# Patient Record
Sex: Male | Born: 1970 | Race: White | Hispanic: No | Marital: Married | Smoking: Never smoker
Health system: Southern US, Community
[De-identification: ages and names within clinical notes are randomized; demographics above are authoritative.]

## PROBLEM LIST (undated history)

## (undated) DIAGNOSIS — E119 Type 2 diabetes mellitus without complications: Secondary | ICD-10-CM

---

## 2013-09-30 ENCOUNTER — Inpatient Hospital Stay (HOSPITAL_COMMUNITY)
Admission: EM | Admit: 2013-09-30 | Discharge: 2013-10-01 | DRG: 195 | Disposition: A | Discharge status: Home / Self Care | Payer: BC Managed Care – PPO | Attending: Internal Medicine | Admitting: Internal Medicine

## 2013-09-30 ENCOUNTER — Encounter (HOSPITAL_COMMUNITY): Payer: Self-pay | Admitting: Emergency Medicine

## 2013-09-30 ENCOUNTER — Emergency Department (HOSPITAL_COMMUNITY): Payer: BC Managed Care – PPO

## 2013-09-30 DIAGNOSIS — R5381 Other malaise: Secondary | ICD-10-CM | POA: Diagnosis present

## 2013-09-30 DIAGNOSIS — R0789 Other chest pain: Secondary | ICD-10-CM | POA: Diagnosis present

## 2013-09-30 DIAGNOSIS — R0902 Hypoxemia: Secondary | ICD-10-CM | POA: Diagnosis present

## 2013-09-30 DIAGNOSIS — Z91199 Patient's noncompliance with other medical treatment and regimen due to unspecified reason: Secondary | ICD-10-CM

## 2013-09-30 DIAGNOSIS — IMO0001 Reserved for inherently not codable concepts without codable children: Secondary | ICD-10-CM | POA: Diagnosis present

## 2013-09-30 DIAGNOSIS — Z9119 Patient's noncompliance with other medical treatment and regimen: Secondary | ICD-10-CM

## 2013-09-30 DIAGNOSIS — J189 Pneumonia, unspecified organism: Principal | ICD-10-CM | POA: Diagnosis present

## 2013-09-30 DIAGNOSIS — E119 Type 2 diabetes mellitus without complications: Secondary | ICD-10-CM

## 2013-09-30 DIAGNOSIS — R5383 Other fatigue: Secondary | ICD-10-CM

## 2013-09-30 DIAGNOSIS — E1165 Type 2 diabetes mellitus with hyperglycemia: Secondary | ICD-10-CM

## 2013-09-30 HISTORY — DX: Type 2 diabetes mellitus without complications: E11.9

## 2013-09-30 LAB — COMPREHENSIVE METABOLIC PANEL
ALT: 17 U/L (ref 0–53)
AST: 16 U/L (ref 0–37)
Albumin: 3.8 g/dL (ref 3.5–5.2)
Alkaline Phosphatase: 63 U/L (ref 39–117)
BUN: 18 mg/dL (ref 6–23)
CO2: 22 meq/L (ref 19–32)
CREATININE: 0.63 mg/dL (ref 0.50–1.35)
Calcium: 9 mg/dL (ref 8.4–10.5)
Chloride: 96 mEq/L (ref 96–112)
Glucose, Bld: 340 mg/dL — ABNORMAL HIGH (ref 70–99)
Potassium: 4.1 mEq/L (ref 3.7–5.3)
SODIUM: 135 meq/L — AB (ref 137–147)
TOTAL PROTEIN: 6.8 g/dL (ref 6.0–8.3)
Total Bilirubin: 0.4 mg/dL (ref 0.3–1.2)

## 2013-09-30 LAB — GLUCOSE, CAPILLARY
Glucose-Capillary: 310 mg/dL — ABNORMAL HIGH (ref 70–99)
Glucose-Capillary: 218 mg/dL — ABNORMAL HIGH (ref 70–99)
Glucose-Capillary: 326 mg/dL — ABNORMAL HIGH (ref 70–99)
Glucose-Capillary: 248 mg/dL — ABNORMAL HIGH (ref 70–99)
Glucose-Capillary: 301 mg/dL — ABNORMAL HIGH (ref 70–99)

## 2013-09-30 LAB — TROPONIN I
Troponin I: <0.3 ng/mL (ref <0.30)
Troponin I: <0.3 ng/mL (ref <0.30)
Troponin I: <0.3 ng/mL (ref <0.30)

## 2013-09-30 LAB — PRO B NATRIURETIC PEPTIDE: Pro B Natriuretic peptide (BNP): 24 pg/mL (ref 0–125)

## 2013-09-30 LAB — CBC WITH DIFFERENTIAL/PLATELET
Basophils Absolute: 0.1 10*3/uL (ref 0.0–0.1)
Basophils Relative: 1 % (ref 0–1)
EOS PCT: 1 % (ref 0–5)
Eosinophils Absolute: 0.2 10*3/uL (ref 0.0–0.7)
HEMATOCRIT: 49.7 % (ref 39.0–52.0)
Hemoglobin: 17.8 g/dL — ABNORMAL HIGH (ref 13.0–17.0)
LYMPHS ABS: 3.7 10*3/uL (ref 0.7–4.0)
LYMPHS PCT: 28 % (ref 12–46)
MCH: 30.2 pg (ref 26.0–34.0)
MCHC: 35.8 g/dL (ref 30.0–36.0)
MCV: 84.4 fL (ref 78.0–100.0)
Monocytes Absolute: 0.4 10*3/uL (ref 0.1–1.0)
Monocytes Relative: 3 % (ref 3–12)
Neutro Abs: 8.8 10*3/uL — ABNORMAL HIGH (ref 1.7–7.7)
Neutrophils Relative %: 67 % (ref 43–77)
Platelets: 254 10*3/uL (ref 150–400)
RBC: 5.89 MIL/uL — AB (ref 4.22–5.81)
RDW: 12.6 % (ref 11.5–15.5)
WBC: 13.1 10*3/uL — ABNORMAL HIGH (ref 4.0–10.5)

## 2013-09-30 LAB — INFLUENZA PANEL BY PCR (TYPE A & B)
H1N1 flu by pcr: NOT DETECTED
INFLBPCR: NEGATIVE
Influenza A By PCR: NEGATIVE

## 2013-09-30 LAB — STREP PNEUMONIAE URINARY ANTIGEN: Strep Pneumo Urinary Antigen: NEGATIVE

## 2013-09-30 LAB — HEMOGLOBIN A1C
Hgb A1c MFr Bld: 6 % — ABNORMAL HIGH (ref <5.7)
MEAN PLASMA GLUCOSE: 126 mg/dL — AB (ref <117)

## 2013-09-30 LAB — D-DIMER, QUANTITATIVE (NOT AT ARMC): D DIMER QUANT: 0.32 ug{FEU}/mL (ref 0.00–0.48)

## 2013-09-30 LAB — CG4 I-STAT (LACTIC ACID): Lactic Acid, Venous: 3.86 mmol/L — ABNORMAL HIGH (ref 0.5–2.2)

## 2013-09-30 MED ORDER — GUAIFENESIN ER 600 MG PO TB12
600.0000 mg | ORAL_TABLET | Freq: Two times a day (BID) | ORAL | Status: DC
  Administered 2013-09-30 – 2013-10-01 (×2): 600 mg via ORAL
  Filled 2013-09-30 (×3): qty 1

## 2013-09-30 MED ORDER — SODIUM CHLORIDE 0.9 % IV SOLN
INTRAVENOUS | Status: AC
  Administered 2013-09-30: 08:00:00 via INTRAVENOUS

## 2013-09-30 MED ORDER — ENOXAPARIN SODIUM 40 MG/0.4ML ~~LOC~~ SOLN
40.0000 mg | SUBCUTANEOUS | Status: DC
  Administered 2013-09-30: 40 mg via SUBCUTANEOUS
  Filled 2013-09-30 (×4): qty 0.4

## 2013-09-30 MED ORDER — IPRATROPIUM-ALBUTEROL 0.5-2.5 (3) MG/3ML IN SOLN: 3.0000 mL | RESPIRATORY_TRACT | Status: DC | PRN

## 2013-09-30 MED ORDER — LEVOFLOXACIN IN D5W 750 MG/150ML IV SOLN
750.0000 mg | Freq: Once | INTRAVENOUS | Status: AC
  Administered 2013-09-30: 750 mg via INTRAVENOUS
  Filled 2013-09-30: qty 150

## 2013-09-30 MED ORDER — LEVOFLOXACIN IN D5W 750 MG/150ML IV SOLN
750.0000 mg | INTRAVENOUS | Status: DC
  Administered 2013-09-30 – 2013-10-01 (×2): 750 mg via INTRAVENOUS
  Filled 2013-09-30 (×2): qty 150

## 2013-09-30 MED ORDER — SODIUM CHLORIDE 0.9 % IV SOLN
INTRAVENOUS | Status: AC
  Administered 2013-09-30: 09:00:00 via INTRAVENOUS

## 2013-09-30 MED ORDER — ACETAMINOPHEN 325 MG PO TABS
650.0000 mg | ORAL_TABLET | Freq: Four times a day (QID) | ORAL | Status: DC | PRN
  Administered 2013-09-30: 650 mg via ORAL
  Filled 2013-09-30: qty 2

## 2013-09-30 MED ORDER — PIOGLITAZONE HCL 30 MG PO TABS
30.0000 mg | ORAL_TABLET | Freq: Every day | ORAL | Status: DC
  Filled 2013-09-30: qty 1

## 2013-09-30 MED ORDER — SODIUM CHLORIDE 0.9 % IV BOLUS (SEPSIS)
1000.0000 mL | Freq: Once | INTRAVENOUS | Status: AC
Start: 2013-09-30 — End: 2013-09-30
  Administered 2013-09-30: 1000 mL via INTRAVENOUS

## 2013-09-30 MED ORDER — SODIUM CHLORIDE 0.9 % IV BOLUS (SEPSIS)
1000.0000 mL | Freq: Once | INTRAVENOUS | Status: AC
  Administered 2013-09-30: 1000 mL via INTRAVENOUS

## 2013-09-30 MED ORDER — INSULIN ASPART 100 UNIT/ML ~~LOC~~ SOLN: 0.0000 [IU] | Freq: Three times a day (TID) | SUBCUTANEOUS | Status: DC

## 2013-09-30 MED ORDER — PIOGLITAZONE HCL 45 MG PO TABS
45.0000 mg | ORAL_TABLET | Freq: Every day | ORAL | Status: DC
  Administered 2013-09-30 – 2013-10-01 (×2): 45 mg via ORAL
  Filled 2013-09-30 (×2): qty 1

## 2013-09-30 NOTE — Progress Notes (Signed)
Patient CBG elevated notified Dr. Gonzella Lexhungel who started patient on SSI with coverage but patient refused insulin but took the actos, even after educating patient.

## 2013-09-30 NOTE — Progress Notes (Signed)
[facility] PROGRESS NOTE  Reatha HarpsMark Mannis ZOX:[id number] DOB: [birth year] DOA: 09/30/2013 PCP: No primary provider on file.  Assessment/Plan: Community acquired pneumonia Placed on empiric abx  sats maintained on RA  continue IV fluids, tylenol and supportive care  follow blood cx, negative urine strep ag. Legionella ag pending. Flu PCR negative. Follow mycoplasma pneumonia ag.  DM Type 2  Uncontrolled. Patient on acts at home and reports not taking medications fr several weeks now. Placed on SSI. Check A1C  chest pain  likely pleuritic and currently pain free  EKG and serial troponins negative. D dimer negative. Low probability for PE.    DVT prophylaxis Sq lovenox  Diet: diabetic  Code Status: full code Family Communication: wife at bedside Disposition Plan: home once improved   Consultants:  none  Procedures:  none  Antibiotics:  IV Levaquin  HPI/Subjective: Feels his SOB to be better but very weak  Objective: Filed Vitals:   09/30/13 0810  BP: 131/68  Pulse: 98  Temp: 97.6 F (36.4 C)  Resp: 20   No intake or output data in the 24 hours ending 09/30/13 1359 Filed Weights   09/30/13 0810  Weight: 101.606 kg (224 lb)    Exam:   General:  Middle aged male in nAD   HEENT: no pallor, moist mucosa  CVS; normal S1&S2 , no MRG  Chest: right sided crackles, no rhonchi or wheeze  Abd: soft, NT, ND, BS+  Ext: warm, no edema;   CNS: AAOX3  Data Reviewed: Basic Metabolic Panel:  Recent Labs Lab 09/30/13 0335  NA 135*  K 4.1  CL 96  CO2 22  GLUCOSE 340*  BUN 18  CREATININE 0.63  CALCIUM 9.0   Liver Function Tests:  Recent Labs Lab 09/30/13 0335  AST 16  ALT 17  ALKPHOS 63  BILITOT 0.4  PROT 6.8  ALBUMIN 3.8   No results found for this basename: LIPASE, AMYLASE,  in the last 168 hours No results found for this basename: AMMONIA,  in the last 168 hours CBC:  Recent Labs Lab 09/30/13 0335  WBC 13.1*  NEUTROABS 8.8*   HGB 17.8*  HCT 49.7  MCV 84.4  PLT 254   Cardiac Enzymes:  Recent Labs Lab 09/30/13 0335 09/30/13 0712 09/30/13 1313  TROPONINI <0.30 <0.30 <0.30   BNP (last 3 results)  Recent Labs  09/30/13 0335  PROBNP 24.0   CBG:  Recent Labs Lab 09/30/13 0331 09/30/13 0743 09/30/13 1207  GLUCAP 310* 218* 326*    No results found for this or any previous visit (from the past 240 hour(s)).   Studies: Dg Chest Port 1 View  09/30/2013   CLINICAL DATA:  Sudden onset chest pain.  EXAM: PORTABLE CHEST - 1 VIEW  COMPARISON:  None.  FINDINGS: There is patchy lung opacity on the right. Pulmonary vascularity is congested, but the patient is supine. There is previous granulomatous infection with calcified right hilar and peritracheal nodes and a calcified nodule in the right apex. No cardiomegaly. No evidence of effusion or pneumothorax.  IMPRESSION: 1. Multi focal airspace disease on the right, primarily concerning for pneumonia. 2. Pulmonary venous congestion which is likely from supine positioning. 3. Remote pulmonary granulomatous infection.   Electronically Signed   By: Tiburcio PeaJonathan  Watts M.D.   On: 09/30/2013 03:53    Scheduled Meds: . sodium chloride   Intravenous STAT  . enoxaparin (LOVENOX) injection  40 mg Subcutaneous Q24H  . insulin aspart  0-15 Units Subcutaneous TID WC  .  levofloxacin (LEVAQUIN) IV  750 mg Intravenous Q24H   Continuous Infusions: . sodium chloride 75 mL/hr at 09/30/13 0929      Time spent: 25 minutes    Tashonna Descoteaux  [facility] Pager [phone]. If 7PM-7AM, please contact night-coverage at [email or web address], password [facility] 09/30/2013, 1:59 PM  LOS: 0 days

## 2013-09-30 NOTE — ED Notes (Signed)
Pt s wife states that he's a diabetic and doesn't take the medication that he's prescribed.

## 2013-09-30 NOTE — ED Notes (Signed)
Pt 88% RA.  Pt put on 2L [place].

## 2013-09-30 NOTE — ED Notes (Signed)
LACTIC ACID RESULTS GIVEN TO EDP YELVERTON 

## 2013-09-30 NOTE — ED Provider Notes (Signed)
CSN: [id number]     Arrival date & time 09/30/13  0308 History   First MD Initiated Contact with Patient 09/30/13 0320     Chief Complaint  Patient presents with  . Tachycardia  . Shortness of Breath   (Consider location/radiation/quality/duration/timing/severity/associated sxs/prior Treatment) HPI Patient states he went to bed he was feeling fine. He woke up before arrival with palpitations, chest tightness across his anterior chest, shortness of breath, cough and diaphoresis. He has a history of diabetes which he does not take medication for. He has no history her artery disease. He has no recent extended travel or surgeries. He denies any lower extremity swelling or pain. States his father had coronary artery disease requiring bypass surgery in his early 25s. Past Medical History  Diagnosis Date  . Diabetes mellitus without complication    History reviewed. No pertinent past surgical history. History reviewed. No pertinent family history. History  Substance Use Topics  . Smoking status: Never Smoker   . Smokeless tobacco: Not on file  . Alcohol Use: No    Review of Systems  Constitutional: Positive for diaphoresis. Negative for fever and chills.  Respiratory: Positive for cough, chest tightness and shortness of breath. Negative for wheezing.   Cardiovascular: Positive for palpitations. Negative for chest pain and leg swelling.  Gastrointestinal: Negative for nausea, vomiting, abdominal pain and diarrhea.  Musculoskeletal: Negative for back pain, neck pain and neck stiffness.  Skin: Negative for rash and wound.  Neurological: Negative for dizziness, syncope, weakness and numbness.  All other systems reviewed and are negative.    Allergies  Lisinopril  Home Medications  No current outpatient prescriptions on file. BP 167/100  Pulse 132  Temp(Src) 98.2 F (36.8 C) (Oral)  Resp 20  SpO2 93% Physical Exam  Nursing note and vitals reviewed. Constitutional: He is oriented  to person, place, and time. He appears well-developed and well-nourished. No distress.  HENT:  Head: Normocephalic and atraumatic.  Mouth/Throat: Oropharynx is clear and moist. No oropharyngeal exudate.  Eyes: EOM are normal. Pupils are equal, round, and reactive to light.  Neck: Normal range of motion. Neck supple.  Cardiovascular: Normal rate and regular rhythm.  Exam reveals no gallop and no friction rub.   No murmur heard. Pulmonary/Chest: Effort normal and breath sounds normal. No respiratory distress. He has no wheezes. He has no rales. He exhibits no tenderness.  Abdominal: Soft. Bowel sounds are normal. He exhibits no distension and no mass. There is no tenderness. There is no rebound and no guarding.  Musculoskeletal: Normal range of motion. He exhibits no edema and no tenderness.  No calf swelling or tenderness.  Neurological: He is alert and oriented to person, place, and time.  Moves all extremities without deficit. Sensation is grossly intact.  Skin: Skin is warm. No rash noted. He is diaphoretic. No erythema.  Psychiatric: He has a normal mood and affect. His behavior is normal.    ED Course  Procedures (including critical care time) Labs Review Labs Reviewed  CBC WITH DIFFERENTIAL  COMPREHENSIVE METABOLIC PANEL  PRO B NATRIURETIC PEPTIDE  TROPONIN I  D-DIMER, QUANTITATIVE   Imaging Review No results found.  EKG Interpretation   None       Date: 09/30/2013  Rate: 91  Rhythm: normal sinus rhythm  QRS Axis: normal  Intervals: normal  ST/T Wave abnormalities: normal  Conduction Disutrbances:none  Narrative Interpretation:   Old EKG Reviewed: none available   MDM   Patient maintains adequate oxygenation on 2 L.  Her rate has resolved. He is in no acute distress. Discussed with [facility] and we'll admit patient for community-acquired pneumonia.   Loren Raceravid Brenin Heidelberger, MD 09/30/13 [phone]

## 2013-09-30 NOTE — ED Notes (Signed)
EKG given to EDP,Yelverton,MD. For review. 

## 2013-09-30 NOTE — Progress Notes (Signed)
Pt. BS level is still elevated, pt refused insulin again even with education. Continue to monitor.

## 2013-09-30 NOTE — H&P (Signed)
[facility] History and Physical  Patient: Derek Morton  ZOX:[id number]  DOB: December 14, 1970  DOS: the patient was seen and examined on 09/30/2013 PCP: No primary provider on file.  Chief Complaint: Cough and shortness of breath  HPI: Meiko Stranahan is a 43 y.o. male with no significant Past medical history. The patient is coming from home. The patient presented with complain of cough and shortness of breath that started last night. He mentions that he was at his baseline until yesterday. He woke up at around 1:00 with a sense of hacking cough and then he started having trouble breathing. He also felt some chest tightness with taking deep breath. He denied any chest pain or chest pressure. He started also some diaphoresis and cough with occasional sputum. Since his symptoms did not resolve within 20 minutes he decided to come to the hospital. He denies any recent hospitalization, sick contacts, travel, trauma, injury, surgery, immobilization orthopnea or PND.  Review of Systems: as mentioned in the history of present illness.  A Comprehensive review of the other systems is negative.  Past Medical History  Diagnosis Date  . Diabetes mellitus without complication    History reviewed. No pertinent past surgical history. Social History:  reports that he has never smoked. He does not have any smokeless tobacco history on file. He reports that he does not drink alcohol. His drug history is not on file. Independent for most of his  ADL.  Allergies  Allergen Reactions  . Lisinopril     History reviewed. No pertinent family history.  Prior to Admission medications   Not on File    Physical Exam: Filed Vitals:   09/30/13 0410 09/30/13 0515 09/30/13 0530 09/30/13 0629  BP: 109/63   136/80  Pulse: 89 101 97   Temp:    98.3 F (36.8 C)  TempSrc:    Oral  Resp: 13 19 22 16   SpO2: 93% 96% 97% 94%    General: Alert, Awake and Oriented to Time, Place and Person. Appear in moderate  distress Eyes: PERRL ENT: Oral Mucosa clear moist. Neck: no JVD Cardiovascular: S1 and S2 Present, no Murmur, Peripheral Pulses Present Respiratory: Bilateral Air entry equal and Decreased, right-sided Crackles, expiratory wheezes Abdomen: Bowel Sound Present, Soft and Non tender Skin: No Rash Extremities: No Pedal edema, no calf tenderness Neurologic: Grossly Unremarkable.  Labs on Admission:  CBC:  Recent Labs Lab 09/30/13 0335  WBC 13.1*  NEUTROABS 8.8*  HGB 17.8*  HCT 49.7  MCV 84.4  PLT 254    CMP     Component Value Date/Time   NA 135* 09/30/2013 0335   K 4.1 09/30/2013 0335   CL 96 09/30/2013 0335   CO2 22 09/30/2013 0335   GLUCOSE 340* 09/30/2013 0335   BUN 18 09/30/2013 0335   CREATININE 0.63 09/30/2013 0335   CALCIUM 9.0 09/30/2013 0335   PROT 6.8 09/30/2013 0335   ALBUMIN 3.8 09/30/2013 0335   AST 16 09/30/2013 0335   ALT 17 09/30/2013 0335   ALKPHOS 63 09/30/2013 0335   BILITOT 0.4 09/30/2013 0335   GFRNONAA >90 09/30/2013 0335   GFRAA >90 09/30/2013 0335    No results found for this basename: LIPASE, AMYLASE,  in the last 168 hours No results found for this basename: AMMONIA,  in the last 168 hours   Recent Labs Lab 09/30/13 0335  TROPONINI <0.30   BNP (last 3 results)  Recent Labs  09/30/13 0335  PROBNP 24.0    Radiological Exams on  Admission: Dg Chest Port 1 View  09/30/2013   CLINICAL DATA:  Sudden onset chest pain.  EXAM: PORTABLE CHEST - 1 VIEW  COMPARISON:  None.  FINDINGS: There is patchy lung opacity on the right. Pulmonary vascularity is congested, but the patient is supine. There is previous granulomatous infection with calcified right hilar and peritracheal nodes and a calcified nodule in the right apex. No cardiomegaly. No evidence of effusion or pneumothorax.  IMPRESSION: 1. Multi focal airspace disease on the right, primarily concerning for pneumonia. 2. Pulmonary venous congestion which is likely from supine positioning. 3. Remote pulmonary  granulomatous infection.   Electronically Signed   By: Tiburcio PeaJonathan  Watts M.D.   On: 09/30/2013 03:53    EKG: Independently reviewed. sinus tachycardia.  Assessment/Plan Principal Problem:   CAP (community acquired pneumonia)   1. CAP (community acquired pneumonia) The patient is presenting with some complaint of shortness of breath with cough. He was hypoxic on arrival in the 80s. At my evaluation he was able to maintain saturations above 94% on room air. He stated his to feel lethargy and shortness of breath and was tachypneic. His chest x-ray shows extensive diffuse involvement of the right side of the lung. He thinks he might have an episode of aspiration while he was sleeping. Thus current differential includes aspiration pneumonia, community-acquired pneumonia, mycoplasma pneumonia. I will obtain blood culture, urine antigens, mycoplasma antigen, I would continue him on IV levofloxacin and IV fluids I will give him flutter valve for secretion clearance and nebulizations as needed He will be admitted in telemetry  2. Chest tightness His initial EKG is negative, his d-dimer is negative, he does not have any high risk factors for DVT or PE, his troponin is also negative. I would follow serial troponin and admitted to telemetry  DVT Prophylaxis: subcutaneous Heparin Nutrition: Advance as tolerated cardiac  Code Status: Full  Family Communication: Wife was present at bedside, opportunity was given to ask question and all questions were answered satisfactorily at the time of interview. Disposition: Admitted to inpatient in telemetry unit.  Author: Lynden OxfordPranav Sadarius Norman, MD [facility] Pager: [phone] 09/30/2013, 6:40 AM    If 7PM-7AM, please contact night-coverage [email or web address] Password [email or web address]

## 2013-09-30 NOTE — ED Notes (Signed)
Pt went to bed last night normal with no complaints, he woke up in the middle of the night having trouble breathing and a irregular heart rate. Pt is coughing in triage and his blood pressure is elevated

## 2013-09-30 NOTE — Progress Notes (Signed)
CARE MANAGEMENT NOTE 09/30/2013  Patient:  Derek Morton,Derek Morton   Account Number:  [id number]  Date Initiated:  09/30/2013  Documentation initiated by:  Joelle Roswell  Subjective/Objective Assessment:   pt with confirm pneumonia multifocal     Action/Plan:   home when stable   Anticipated DC Date:  10/03/2013   Anticipated DC Plan:  HOME/SELF CARE  In-house referral  NA      DC Planning Services  NA      [facility]  NA   Choice offered to / List presented to:  NA   DME arranged  NA      DME agency  NA     HH arranged  NA      HH agency  NA   Status of service:  In process, will continue to follow Medicare Important Message given?  NA - LOS <3 / Initial given by admissions (If response is "NO", the following Medicare IM given date fields will be blank) Date Medicare IM given:   Date Additional Medicare IM given:    Discharge Disposition:    Per UR Regulation:  Reviewed for med. necessity/level of care/duration of stay  If discussed at Long Length of Stay Meetings, dates discussed:    Comments:  [id number]/Jennier Schissler,RN,BSN,CCM

## 2013-09-30 NOTE — ED Notes (Signed)
Pt. CBG 310. Notified RN,Merle.

## 2013-09-30 NOTE — Progress Notes (Signed)
PAtient admitted from ED to 1424, alert and oriented, denies any pain/distress. Oriented patient to room/unit and reviewed plan of care with patient and wife. No wound noted, skin intact.

## 2013-10-01 DIAGNOSIS — E119 Type 2 diabetes mellitus without complications: Secondary | ICD-10-CM

## 2013-10-01 LAB — LEGIONELLA ANTIGEN, URINE: Legionella Antigen, Urine: NEGATIVE

## 2013-10-01 LAB — GLUCOSE, CAPILLARY: Glucose-Capillary: 240 mg/dL — ABNORMAL HIGH (ref 70–99)

## 2013-10-01 MED ORDER — GUAIFENESIN ER 600 MG PO TB12: 600.0000 mg | ORAL_TABLET | Freq: Two times a day (BID) | ORAL | Status: AC

## 2013-10-01 MED ORDER — LEVOFLOXACIN 750 MG PO TABS: 750.0000 mg | ORAL_TABLET | Freq: Every day | ORAL | Status: AC

## 2013-10-01 MED ORDER — PIOGLITAZONE HCL 45 MG PO TABS: 45.0000 mg | ORAL_TABLET | Freq: Every day | ORAL | Status: AC

## 2013-10-01 NOTE — Progress Notes (Signed)
Pt left at this time with his spouse at his side.  Alert, oriented, and without c/o. Discharge instructions/prescriptions given/explained with pt and spouse verbalizing understanding. Followup appointment noted.

## 2013-10-01 NOTE — Discharge Instructions (Signed)

## 2013-10-01 NOTE — Discharge Summary (Addendum)
Physician Discharge Summary  Derek Morton UEA:[id number] DOB: 1971-03-30 DOA: 09/30/2013  PCP: Joycelyn Rua, MD  Admit date: 09/30/2013 Discharge date: 10/01/2013  Time spent: 40 minutes  Recommendations for Outpatient Follow-up:  Home with outpt follow up with PCP in 1 week Please follow blood cx, urine legionella and mycoplasma ab   Discharge Diagnoses:  Principal Problem:   CAP (community acquired pneumonia)  Active Problems:   Type II or unspecified type diabetes mellitus without mention of complication, not stated as uncontrolled   Discharge Condition: fair  Diet recommendation: diabetic  Filed Weights   09/30/13 0810  Weight: 101.606 kg (224 lb)    History of present illness:  Please refer to admission H&P for details, but in brief, 43 y/o male with hx of DM type 2  presented with complain of cough and shortness of breath that started last night. He mentioned that he was normal until the previous day. He woke up at around 1:00 with a sense of hacking cough and then he started having trouble breathing. He also felt some chest tightness with taking deep breath. He denied any chest pain or chest pressure. He started also some diaphoresis and cough with occasional sputum. Since his symptoms did not resolve within 20 minutes he decided to come to the hospital.  He denies any recent hospitalization, sick contacts, travel, trauma, injury, surgery, immobilization orthopnea or PND.   Hospital Course:   Community acquired pneumonia  Placed on empiric abx with IV levaquin sats maintained on RA  continued IV fluids, tylenol and supportive care   negative urine strep ag and flu PCR. -patient remains afebrile for 24 hrs. Symptoms of SOB and cough improved. Stable for discharge home on 5 more days fo oral Levaquin to complete a 7 day course.   follow blood cx, Legionella ag  mycoplasma pneumonia ag as outpt DM Type 2  . Patient on acts at home and reports not taking medications  fr several weeks now. Placed on SSI. A1C of 6.  FSG elevated in the hospital but patient refused sliding scale corrections. counsled on dietary and medication compliance.   chest pain on admission likely pleuritic and currently pain free  EKG and serial troponins negative. D dimer negative. Low probability for PE.    Diet: diabetic   Code Status: full code   Family Communication: wife at bedside  Disposition Plan: home  Consultants:  None   Procedures:  None   Antibiotics:   Levaquin    Discharge Exam: Filed Vitals:   10/01/13 0458  BP: 104/57  Pulse: 70  Temp: 97.9 F (36.6 C)  Resp: 18    General: Middle aged male in nAD  HEENT: no pallor, moist mucosa  CVS; normal S1&S2 , no MRG  Chest: right sided crackles( improved from yesterday), no rhonchi or wheeze  Abd: soft, NT, ND, BS+  Ext: warm, no edema;  CNS: AAOX3   Discharge Instructions     Medication List         guaiFENesin 600 MG 12 hr tablet  Commonly known as:  MUCINEX  Take 1 tablet (600 mg total) by mouth 2 (two) times daily.     levofloxacin 750 MG tablet  Commonly known as:  LEVAQUIN  Take 1 tablet (750 mg total) by mouth daily.     pioglitazone 45 MG tablet  Commonly known as:  ACTOS  Take 1 tablet (45 mg total) by mouth daily.       Allergies  Allergen Reactions  .  Lisinopril        Follow-up Information   Follow up with Joycelyn RuaMEYERS, STEPHEN, MD. Schedule an appointment as soon as possible for a visit in 1 week.   Specialty:  Family Medicine   Contact information:   [address] [place] [address] [phone]        The results of significant diagnostics from this hospitalization (including imaging, microbiology, ancillary and laboratory) are listed below for reference.    Significant Diagnostic Studies: Dg Chest Port 1 View  09/30/2013   CLINICAL DATA:  Sudden onset chest pain.  EXAM: PORTABLE CHEST - 1 VIEW  COMPARISON:  None.  FINDINGS: There is patchy  lung opacity on the right. Pulmonary vascularity is congested, but the patient is supine. There is previous granulomatous infection with calcified right hilar and peritracheal nodes and a calcified nodule in the right apex. No cardiomegaly. No evidence of effusion or pneumothorax.  IMPRESSION: 1. Multi focal airspace disease on the right, primarily concerning for pneumonia. 2. Pulmonary venous congestion which is likely from supine positioning. 3. Remote pulmonary granulomatous infection.   Electronically Signed   By: Tiburcio PeaJonathan  Watts M.D.   On: 09/30/2013 03:53    Microbiology: No results found for this or any previous visit (from the past 240 hour(s)).   Labs: Basic Metabolic Panel:  Recent Labs Lab 09/30/13 0335  NA 135*  K 4.1  CL 96  CO2 22  GLUCOSE 340*  BUN 18  CREATININE 0.63  CALCIUM 9.0   Liver Function Tests:  Recent Labs Lab 09/30/13 0335  AST 16  ALT 17  ALKPHOS 63  BILITOT 0.4  PROT 6.8  ALBUMIN 3.8   No results found for this basename: LIPASE, AMYLASE,  in the last 168 hours No results found for this basename: AMMONIA,  in the last 168 hours CBC:  Recent Labs Lab 09/30/13 0335  WBC 13.1*  NEUTROABS 8.8*  HGB 17.8*  HCT 49.7  MCV 84.4  PLT 254   Cardiac Enzymes:  Recent Labs Lab 09/30/13 0335 09/30/13 0712 09/30/13 1313 09/30/13 1955  TROPONINI <0.30 <0.30 <0.30 <0.30   BNP: BNP (last 3 results)  Recent Labs  09/30/13 0335  PROBNP 24.0   CBG:  Recent Labs Lab 09/30/13 0743 09/30/13 1207 09/30/13 1734 09/30/13 2052 10/01/13 0727  GLUCAP 218* 326* 248* 301* 240*       Signed:  Tanna Loeffler  [facility] 10/01/2013, 9:15 AM

## 2013-10-04 LAB — MYCOPLASMA PNEUMONIAE ANTIBODY, IGM: Mycoplasma pneumo IgM: 26 U/mL (ref <770)

## 2013-10-06 LAB — CULTURE, BLOOD (ROUTINE X 2)
CULTURE: NO GROWTH
Culture: NO GROWTH

## 2015-04-21 IMAGING — CR DG CHEST 1V PORT
1 series · 2 of 2 positions shown · non-contrast
Comparison: None.

CLINICAL DATA: Sudden onset chest pain.

EXAM:
PORTABLE CHEST - 1 VIEW

[Series 1: AP · U · 2 of 2 slices shown]
[im 1/2]
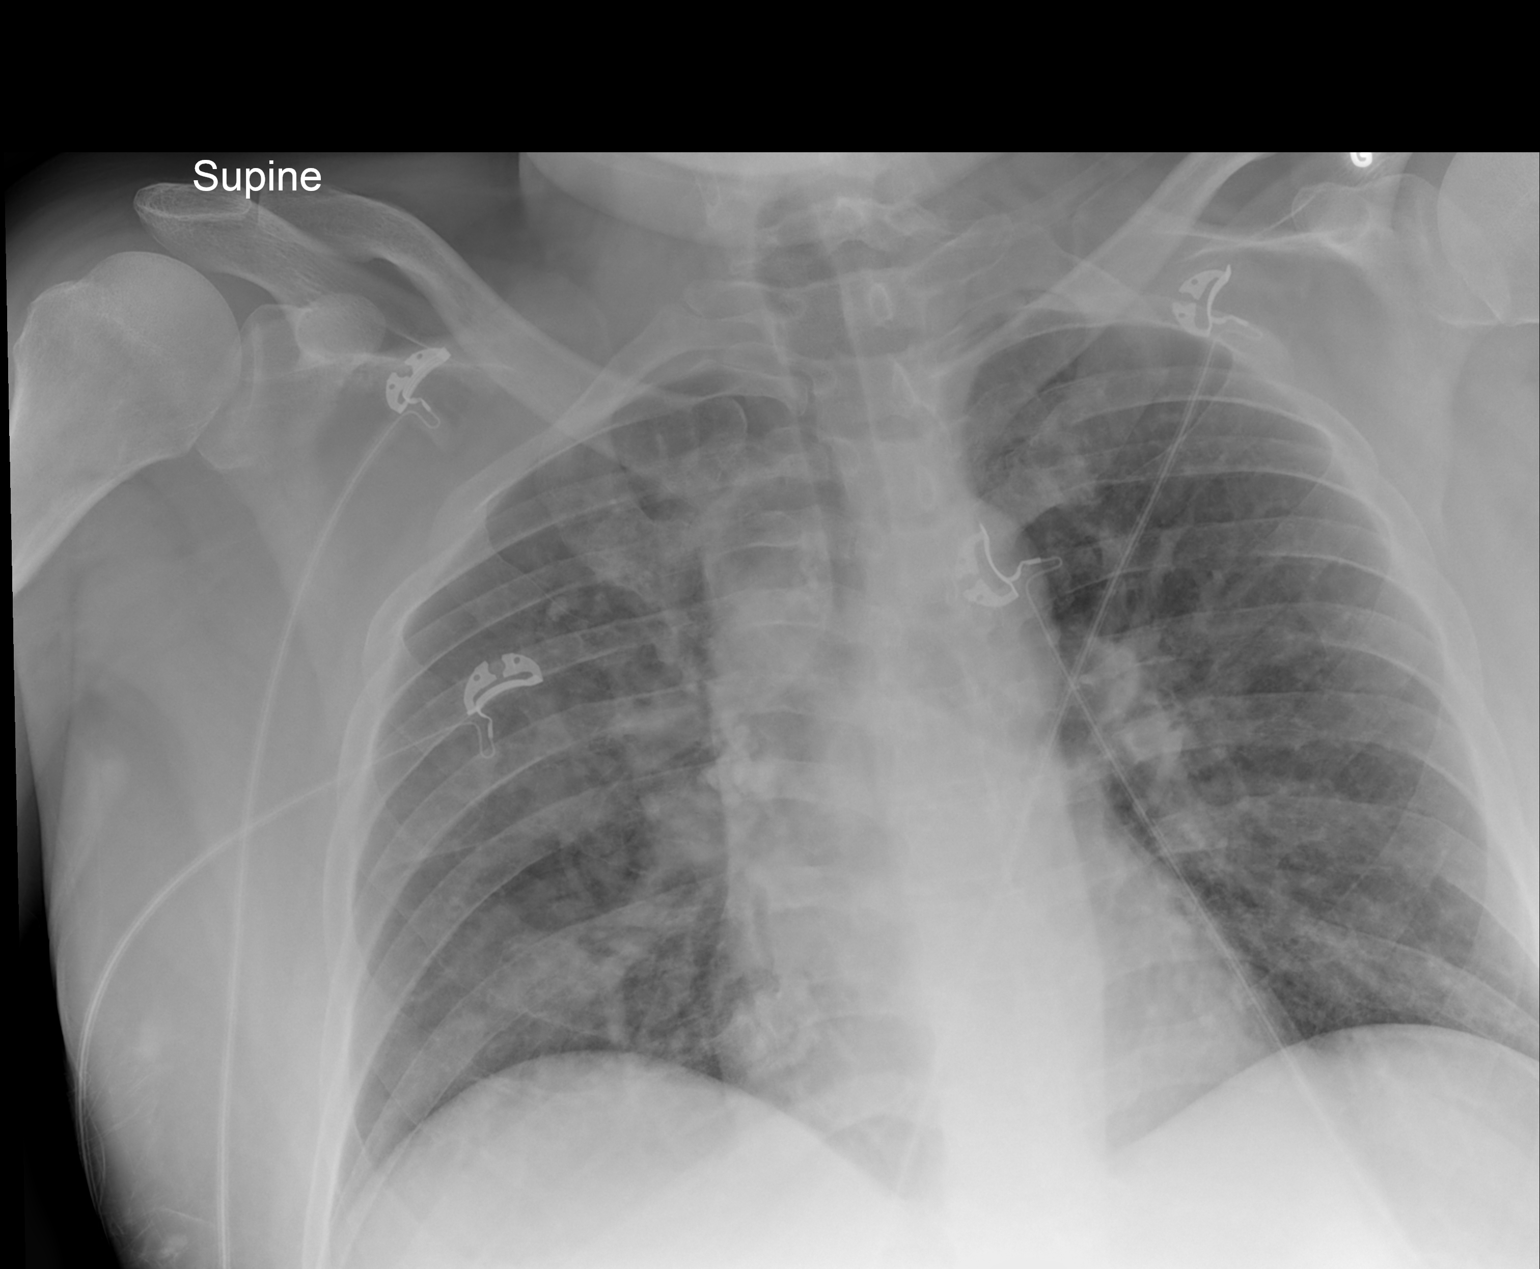
[im 2/2]
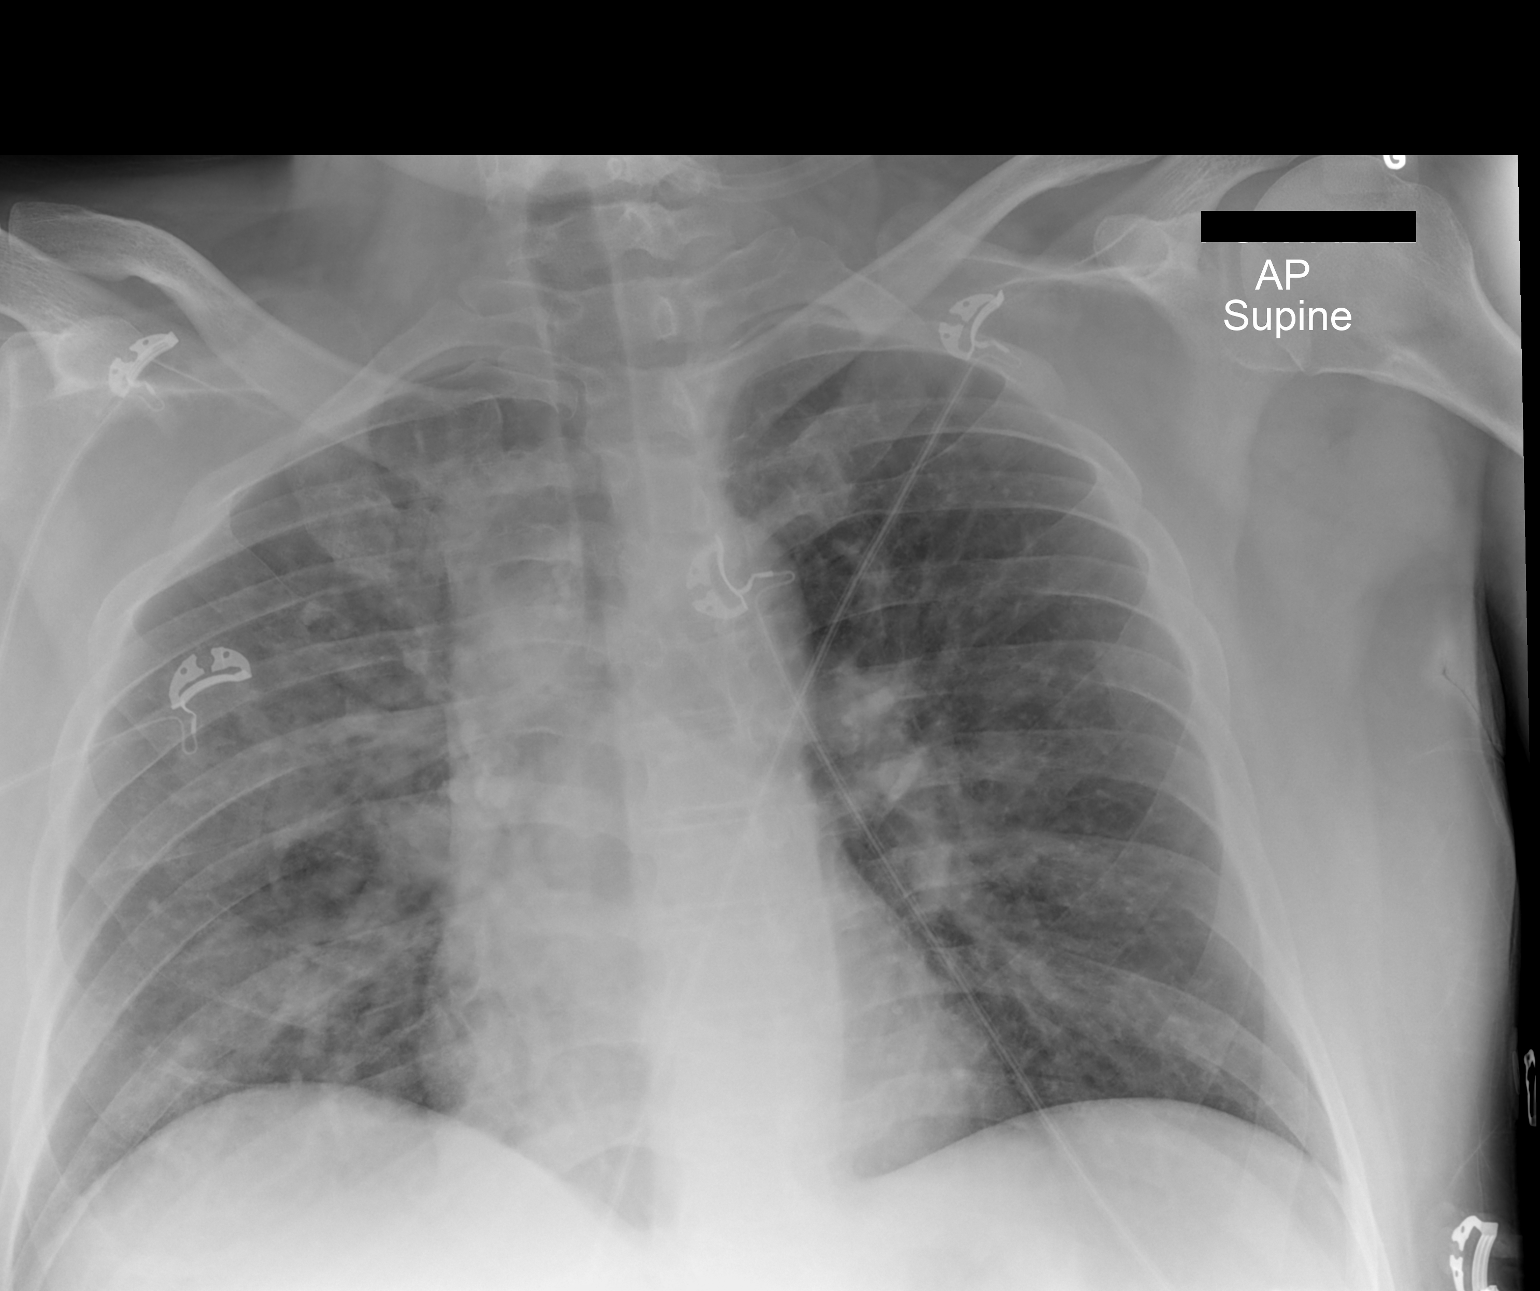

[2 of 2 positions shown; findings below may reference images not displayed]

FINDINGS: There is patchy lung opacity on the right. Pulmonary vascularity is
congested, but the patient is supine. There is previous
granulomatous infection with calcified right hilar and peritracheal
nodes and a calcified nodule in the right apex. No cardiomegaly. No
evidence of effusion or pneumothorax.
IMPRESSION: 1. Multi focal airspace disease on the right, primarily concerning
for pneumonia.
2. Pulmonary venous congestion which is likely from supine
positioning.
3. Remote pulmonary granulomatous infection.
